# Patient Record
Sex: Male | Born: 1995 | Race: White | Hispanic: No | Marital: Single | Smoking: Never smoker
Health system: Southern US, Community
[De-identification: ages and names within clinical notes are randomized; demographics above are authoritative.]

---
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
## 2016-04-05 ENCOUNTER — Emergency Department (HOSPITAL_COMMUNITY)
Admission: EM | Admit: 2016-04-05 | Discharge: 2016-04-05 | Disposition: A | Discharge status: Home / Self Care | Payer: BLUE CROSS/BLUE SHIELD | Attending: Emergency Medicine | Admitting: Emergency Medicine

## 2016-04-05 ENCOUNTER — Encounter (HOSPITAL_COMMUNITY): Payer: Self-pay | Admitting: Emergency Medicine

## 2016-04-05 ENCOUNTER — Emergency Department (HOSPITAL_COMMUNITY): Payer: BLUE CROSS/BLUE SHIELD

## 2016-04-05 DIAGNOSIS — Y998 Other external cause status: Secondary | ICD-10-CM | POA: Diagnosis not present

## 2016-04-05 DIAGNOSIS — Y929 Unspecified place or not applicable: Secondary | ICD-10-CM | POA: Insufficient documentation

## 2016-04-05 DIAGNOSIS — W2103XA Struck by baseball, initial encounter: Secondary | ICD-10-CM | POA: Diagnosis not present

## 2016-04-05 DIAGNOSIS — S9032XA Contusion of left foot, initial encounter: Secondary | ICD-10-CM | POA: Diagnosis not present

## 2016-04-05 DIAGNOSIS — S99922A Unspecified injury of left foot, initial encounter: Secondary | ICD-10-CM | POA: Diagnosis present

## 2016-04-05 DIAGNOSIS — Y9364 Activity, baseball: Secondary | ICD-10-CM | POA: Insufficient documentation

## 2016-04-05 MED ORDER — IBUPROFEN 400 MG PO TABS
400.0000 mg | ORAL_TABLET | Freq: Once | ORAL | Status: AC
  Administered 2016-04-05: 400 mg via ORAL
  Filled 2016-04-05: qty 1

## 2016-04-05 MED ORDER — IBUPROFEN 600 MG PO TABS: 600.0000 mg | ORAL_TABLET | Freq: Three times a day (TID) | ORAL | 0 refills | Status: AC

## 2016-04-05 NOTE — ED Notes (Signed)
Patient given discharge instruction, verbalized understand. Patient ambulatory on cruthces out of the department.

## 2016-04-05 NOTE — ED Provider Notes (Signed)
[facility]-EMERGENCY DEPT Provider Note   CSN: [id number] Arrival date & time: 04/05/16  1714  By signing my name below, I, Cynda AcresHailei Fulton, attest that this documentation has been prepared under the direction and in the presence of Burgess AmorJulie Yan Okray, PA-C.  Electronically Signed: Cynda AcresHailei Fulton, Scribe. 04/05/16. 6:58 PM.  History   Chief Complaint Chief Complaint  Patient presents with  . Foot Injury    HPI Comments: Javier Gibbs is a 21 y.o. male with no pertinent medical history, who presents to the Emergency Department complaining of sudden-onset, constant left foot pain that began earlier today. Patient reports playing a baseball game, when the baseball hit his left foot. Patient was unable to finish the game. Patient has associated great toe pain, swelling, and redness. No modifying factors indicated. Patient denies any medication allergies. Patient states the pain is worse when placing pressure and bearing weight on the left foot. Patient ambulatory with a limp in the emergency department. Patient denies any ankle pain, plantar foot pain, or any other symptoms. He applied ice for 15 minutes immediately after the event with reduction in swelling.  The history is provided by the patient. No language interpreter was used.    History reviewed. No pertinent past medical history.  There are no active problems to display for this patient.   Past Surgical History:  Procedure Laterality Date  . TONSILLECTOMY AND ADENOIDECTOMY N/A 04/04/2015   Procedure: TONSILLECTOMY AND ADENOIDECTOMY;  Surgeon: Linus Salmonshapman McQueen, MD;  [facility];  Service: ENT;  Laterality: N/A;       Home Medications    Prior to Admission medications   Medication Sig Start Date End Date Taking? Authorizing Provider  HYDROcodone-acetaminophen (HYCET) 7.5-325 mg/15 ml solution Take 10 mLs by mouth every 4 (four) hours as needed for moderate pain. 04/04/15   Linus Salmonshapman McQueen, MD  ibuprofen (ADVIL,MOTRIN) 600 MG  tablet Take 1 tablet (600 mg total) by mouth 3 (three) times daily. 04/05/16   Burgess AmorJulie Ruthella Kirchman, PA-C    Family History History reviewed. No pertinent family history.  Social History Social History  Substance Use Topics  . Smoking status: Never Smoker  . Smokeless tobacco: Never Used  . Alcohol use No     Allergies   Patient has no known allergies.   Review of Systems Review of Systems  Musculoskeletal: Positive for arthralgias (right foot) and joint swelling (right foot). Negative for gait problem.  Skin: Positive for color change.  Neurological: Negative for weakness and numbness.     Physical Exam Updated Vital Signs BP 133/68 (BP Location: Left Arm)   Pulse 83   Temp 98 F (36.7 C) (Oral)   Resp 18   Ht 5\' 7"  (1.702 m)   Wt 72.6 kg   SpO2 100%   BMI 25.06 kg/m   Physical Exam  Constitutional: He is oriented to person, place, and time. He appears well-developed.  HENT:  Head: Normocephalic and atraumatic.  Mouth/Throat: Oropharynx is clear and moist.  Eyes: Conjunctivae and EOM are normal. Pupils are equal, round, and reactive to light.  Neck: Normal range of motion. Neck supple.  Cardiovascular: Normal rate.   Pulmonary/Chest: Effort normal.  Musculoskeletal: He exhibits tenderness. He exhibits no edema or deformity.  Hematoma of the left dorsal foot. Distal sensation is intact.Dorsalis pedis pulses full. Patient can flex and extend at ankle without pain. Flexion of the toes increases pain at the hematoma site. Achilles non-tender without palpable deformity. Ankle is non-tender.   Neurological: He is alert  and oriented to person, place, and time.  Skin: Skin is warm and dry.  Psychiatric: He has a normal mood and affect.     ED Treatments / Results  DIAGNOSTIC STUDIES: Oxygen Saturation is 100% on RA, normal by my interpretation.    COORDINATION OF CARE: 6:58 PM Discussed treatment plan with pt at bedside and pt agreed to plan, which includes  anti-inflammatory pain medication and crutches.   Labs (all labs ordered are listed, but only abnormal results are displayed) Labs Reviewed - No data to display  EKG  EKG Interpretation None       Radiology Dg Foot Complete Left  Result Date: 04/05/2016 CLINICAL DATA:  Baseball hit left foot. Large red area on the top of the foot across the metatarsals. EXAM: LEFT FOOT - COMPLETE 3+ VIEW COMPARISON:  None. FINDINGS: There is no evidence of fracture or dislocation. There is no evidence of arthropathy or other focal bone abnormality. Soft tissues are unremarkable. IMPRESSION: No acute abnormality. Electronically Signed   By: Richarda Overlie M.D.   On: 04/05/2016 18:50    Procedures Procedures (including critical care time)  Medications Ordered in ED Medications  ibuprofen (ADVIL,MOTRIN) tablet 400 mg (not administered)     Initial Impression / Assessment and Plan / ED Course  I have reviewed the triage vital signs and the nursing notes.  Pertinent labs & imaging results that were available during my care of the patient were reviewed by me and considered in my medical decision making (see chart for details).     Foot contusion/hematoma.  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  Final Clinical Impressions(s) / ED Diagnoses   Final diagnoses:  Contusion of left foot, initial encounter    New Prescriptions New Prescriptions   IBUPROFEN (ADVIL,MOTRIN) 600 MG TABLET    Take 1 tablet (600 mg total) by mouth 3 (three) times daily.   I personally performed the services described in this documentation, which was scribed in my presence. The recorded information has been reviewed and is accurate.     Burgess Amor, PA-C 04/05/16 1915    Samuel Jester, DO 04/06/16 [phone]

## 2016-04-05 NOTE — Discharge Instructions (Signed)
Continue using ice and elevation as much as possible for the next 2 days.  Use the crutches for comfort, my hope is you will not need them after the next 48 hours.  If you have worsening or persistent pain or return of swelling when you try to weight bear, you should followup as discussed with your teams orthopedist.  Your xrays are negative tonight.

## 2016-04-05 NOTE — ED Notes (Signed)
Foot red and swollen, painful with flexion

## 2016-04-05 NOTE — ED Triage Notes (Signed)
Pt reports a baseball hitting his left foot in practice today.

## 2017-02-02 ENCOUNTER — Ambulatory Visit
Admission: EM | Admit: 2017-02-02 | Discharge: 2017-02-02 | Discharge status: Absent without leave | Payer: BLUE CROSS/BLUE SHIELD
